# Patient Record
Sex: Male | Born: 1995 | Race: White | Hispanic: Yes | Marital: Single | Smoking: Never smoker
Health system: Southern US, Community
[De-identification: ages and names within clinical notes are randomized; demographics above are authoritative.]

## PROBLEM LIST (undated history)

## (undated) DIAGNOSIS — H9190 Unspecified hearing loss, unspecified ear: Secondary | ICD-10-CM

## (undated) DIAGNOSIS — F909 Attention-deficit hyperactivity disorder, unspecified type: Secondary | ICD-10-CM

## (undated) DIAGNOSIS — E669 Obesity, unspecified: Secondary | ICD-10-CM

## (undated) DIAGNOSIS — T7840XA Allergy, unspecified, initial encounter: Secondary | ICD-10-CM

## (undated) HISTORY — DX: Allergy, unspecified, initial encounter: T78.40XA

## (undated) HISTORY — DX: Unspecified hearing loss, unspecified ear: H91.90

## (undated) HISTORY — DX: Attention-deficit hyperactivity disorder, unspecified type: F90.9

## (undated) HISTORY — DX: Obesity, unspecified: E66.9

---
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
## 2019-07-01 ENCOUNTER — Other Ambulatory Visit: Payer: Self-pay

## 2019-07-01 ENCOUNTER — Ambulatory Visit
Admission: EM | Admit: 2019-07-01 | Discharge: 2019-07-01 | Disposition: A | Discharge status: Home / Self Care | Payer: Self-pay | Attending: Emergency Medicine | Admitting: Emergency Medicine

## 2019-07-01 ENCOUNTER — Encounter: Payer: Self-pay | Admitting: Emergency Medicine

## 2019-07-01 DIAGNOSIS — N50812 Left testicular pain: Secondary | ICD-10-CM

## 2019-07-01 DIAGNOSIS — N50811 Right testicular pain: Secondary | ICD-10-CM

## 2019-07-01 LAB — POCT URINALYSIS DIP (MANUAL ENTRY)
Bilirubin, UA: NEGATIVE
Blood, UA: NEGATIVE
Glucose, UA: NEGATIVE mg/dL
Ketones, POC UA: NEGATIVE mg/dL
Leukocytes, UA: NEGATIVE
Nitrite, UA: NEGATIVE
Protein Ur, POC: NEGATIVE mg/dL
Spec Grav, UA: 1.02 (ref 1.010–1.025)
Urobilinogen, UA: 1 E.U./dL
pH, UA: 7.5 (ref 5.0–8.0)

## 2019-07-01 NOTE — ED Notes (Signed)
Patient able to ambulate independently  

## 2019-07-01 NOTE — ED Triage Notes (Signed)
Pt presents to [facility] for assessment of bilateral testicular pain, penile pain, and muscular pain to the groin x 3 days.  Pt also c/o pink tinged urine yesterday.

## 2019-07-01 NOTE — Discharge Instructions (Signed)
23 year old male with 3-day course of throbbing, penile and bilateral testicular pain.  Patient denies penile discharge, rash.  Does endorse decreased urine output.  No hernia appreciated on exam today.  POCT urine dipstick unremarkable in office today.  Patient referred to ER for further evaluation/management (torsion rule out).

## 2019-07-01 NOTE — ED Provider Notes (Signed)
[facility]    CSN: [id number] Arrival date & time: 07/01/19  1935      History   Chief Complaint Chief Complaint  Patient presents with  . Testicle Pain    HPI Tim Evans is a 23 y.o. male presenting with 3-day course of bilateral testicular and penile pain.  States it is throbbing, pins-and-needles sensation that comes and goes and is worse with walking.  Not exacerbated by Valsalva.  Patient denies change in color or penile testicular swelling or discharge.  Patient did note a pinkish discoloration to his urine last night and around 4 or 5 this evening.  Patient is currently sexually active with one male partner, routinely using condoms.  States he was tested for STDs a few weeks ago and was negative.  Denies history of torsion, cryptorchidism, trauma to the area.   Past Medical History:  Diagnosis Date  . ADHD (attention deficit hyperactivity disorder)   . Allergy   . Asthma   . Hearing loss   . Obesity     Patient Active Problem List   Diagnosis Date Noted  . Complicated migraine 11/13/2014  . Migraine with aura and without status migrainosus, not intractable 04/22/2014  . Headache(784.0) 01/22/2014  . Dizzy spells 01/22/2014  . Learning difficulty 01/22/2014    History reviewed. No pertinent surgical history.     Home Medications    Prior to Admission medications   Medication Sig Start Date End Date Taking? Authorizing Provider  cetirizine (ZYRTEC) 10 MG tablet Take 10 mg by mouth daily.   Yes [provider]  albuterol (PROVENTIL HFA;VENTOLIN HFA) 108 (90 BASE) MCG/ACT inhaler Inhale 2 puffs into the lungs every 6 (six) hours as needed for wheezing or shortness of breath (30 minutes before activity).     [provider]  Ginkgo Biloba (GINKOBA) 40 MG TABS Take 40 mg by mouth daily.    [provider]  Magnesium Oxide 500 MG TABS Take by mouth.    [provider]  riboflavin (VITAMIN B-2) 100 MG  TABS tablet Take 100 mg by mouth daily.    [provider]  SUMAtriptan (IMITREX) 50 MG tablet TAKE ONE TABLET BY MOUTH WHEN NECESSARY FOR MODERATE TO SEVERE HEADACHE, MAXIMUM 2 TIMES A WEEK 01/05/16   Elveria RisingGoodpasture, Tina, NP  fluticasone (FLONASE) 50 MCG/ACT nasal spray Place 1 spray into the nose daily as needed for allergies or rhinitis.   07/01/19  [provider]    Family History Family History  Problem Relation Age of Onset  . Diabetes Mother        2007  . Hyperlipidemia Mother   . Migraines Mother   . Headache Mother   . Diabetes Father        2012  . ADD / ADHD Father   . Diabetes Maternal Grandmother   . Diabetes Maternal Grandfather   . Hyperlipidemia Maternal Grandfather   . Diabetes Paternal Grandfather   . Autism spectrum disorder Brother   . ADD / ADHD Brother   . Diabetes Paternal Aunt   . Diabetes Paternal Aunt     Social History Social History   Tobacco Use  . Smoking status: Never Smoker  . Smokeless tobacco: Never Used  Substance Use Topics  . Alcohol use: No  . Drug use: No     Allergies   Other   Review of Systems Review of Systems  Constitutional: Negative for fatigue and fever.  Respiratory: Negative for cough and shortness of  breath.   Cardiovascular: Negative for chest pain and palpitations.  Gastrointestinal: Negative for abdominal pain, diarrhea and vomiting.  Genitourinary: Positive for penile pain and testicular pain. Negative for discharge, dysuria, frequency, genital sores, hematuria, penile swelling, scrotal swelling and urgency.  Musculoskeletal: Negative for arthralgias and myalgias.  Skin: Negative for rash and wound.  Neurological: Negative for speech difficulty and headaches.  All other systems reviewed and are negative.    Physical Exam Triage Vital Signs ED Triage Vitals  Enc Vitals Group     BP      Pulse      Resp      Temp      Temp src      SpO2      Weight      Height      Head  Circumference      Peak Flow      Pain Score      Pain Loc      Pain Edu?      Excl. in [place]?    No data found.  Updated Vital Signs BP 134/86 (BP Location: Left Arm)   Pulse 76   Temp 98.9 F (37.2 C) (Oral)   Resp 16   SpO2 98%   Visual Acuity Right Eye Distance:   Left Eye Distance:   Bilateral Distance:    Right Eye Near:   Left Eye Near:    Bilateral Near:     Physical Exam Exam conducted with a chaperone present.  Constitutional:      General: He is not in acute distress.    Appearance: Normal appearance. He is not ill-appearing.  HENT:     Head: Normocephalic and atraumatic.  Eyes:     General: No scleral icterus.    Pupils: Pupils are equal, round, and reactive to light.  Cardiovascular:     Rate and Rhythm: Normal rate.  Pulmonary:     Effort: Pulmonary effort is normal. No respiratory distress.     Breath sounds: No wheezing.  Abdominal:     Hernia: There is no hernia in the left inguinal area or right inguinal area.  Genitourinary:    Pubic Area: No rash or pubic lice.      Penis: Uncircumcised. Tenderness present. No phimosis, paraphimosis, erythema, discharge, swelling or lesions.      Scrotum/Testes:        Right: Tenderness present. Mass, swelling, testicular hydrocele or varicocele not present. Right testis is descended. Cremasteric reflex is present.         Left: Tenderness present. Mass, swelling, testicular hydrocele or varicocele not present. Left testis is descended. Cremasteric reflex is absent.      Epididymis:     Right: Not enlarged. Tenderness present. No mass.     Left: Not enlarged. Tenderness present. No mass.     Tanner stage (genital): 5.     Comments: Patient reporting diffuse tenderness over proximal penile shaft, testes bilaterally.  No discoloration present throughout Lymphadenopathy:     Lower Body: No right inguinal adenopathy. No left inguinal adenopathy.  Skin:    Coloration: Skin is not jaundiced or pale.  Neurological:      Mental Status: He is alert and oriented to person, place, and time.      UC Treatments / Results  Labs (all labs ordered are listed, but only abnormal results are displayed) Labs Reviewed  POCT URINALYSIS DIP (MANUAL ENTRY)    EKG   Radiology No results found.  Procedures  Procedures (including critical care time)  Medications Ordered in UC Medications - No data to display  Initial Impression / Assessment and Plan / UC Course  I have reviewed the triage vital signs and the nursing notes.  Pertinent labs & imaging results that were available during my care of the patient were reviewed by me and considered in my medical decision making (see chart for details).     Patient afebrile, nontoxic.  POCT urine dipstick done in office, reviewed by me: Unremarkable.  Due to negative cremasteric reflex with diffuse tenderness with and without palpation, this provider recommended patient go to ER for stat ultrasound given after hours and concern for testicular torsion.  Patient verbalized understanding, will self transport to ER.  Return precautions discussed, patient verbalized understanding and is agreeable to plan. Final Clinical Impressions(s) / UC Diagnoses   Final diagnoses:  Pain in both testicles     Discharge Instructions     23 year old male with 3-day course of throbbing, penile and bilateral testicular pain.  Patient denies penile discharge, rash.  Does endorse decreased urine output.  No hernia appreciated on exam today.  POCT urine dipstick unremarkable in office today.  Patient referred to ER for further evaluation/management (torsion rule out).    ED Prescriptions    None     PDMP not reviewed this encounter.   Odette Fraction [place], [place] 07/04/19 [phone]

## 2019-08-14 ENCOUNTER — Ambulatory Visit: Payer: Self-pay | Attending: Internal Medicine

## 2019-08-14 DIAGNOSIS — Z20822 Contact with and (suspected) exposure to covid-19: Secondary | ICD-10-CM

## 2019-08-14 DIAGNOSIS — Z20828 Contact with and (suspected) exposure to other viral communicable diseases: Secondary | ICD-10-CM | POA: Insufficient documentation

## 2019-08-15 LAB — NOVEL CORONAVIRUS, NAA: SARS-CoV-2, NAA: NOT DETECTED

## 2022-02-21 ENCOUNTER — Ambulatory Visit
Admission: RE | Admit: 2022-02-21 | Discharge: 2022-02-21 | Disposition: A | Discharge status: Home / Self Care | Payer: 59 | Source: Ambulatory Visit | Attending: Emergency Medicine | Admitting: Emergency Medicine

## 2022-02-21 VITALS — BP 121/77 | HR 89 | Temp 98.1°F | Resp 18

## 2022-02-21 DIAGNOSIS — J4521 Mild intermittent asthma with (acute) exacerbation: Secondary | ICD-10-CM

## 2022-02-21 DIAGNOSIS — J309 Allergic rhinitis, unspecified: Secondary | ICD-10-CM | POA: Diagnosis not present

## 2022-02-21 MED ORDER — MONTELUKAST SODIUM 10 MG PO TABS: 10.0000 mg | ORAL_TABLET | Freq: Every day | ORAL | 2 refills | Status: AC

## 2022-02-21 MED ORDER — ALBUTEROL SULFATE HFA 108 (90 BASE) MCG/ACT IN AERS: 2.0000 | INHALATION_SPRAY | Freq: Four times a day (QID) | RESPIRATORY_TRACT | 0 refills | Status: DC | PRN

## 2022-02-21 MED ORDER — FLUTICASONE PROPIONATE 50 MCG/ACT NA SUSP: 1.0000 | Freq: Every day | NASAL | 2 refills | Status: AC

## 2022-02-21 MED ORDER — CETIRIZINE HCL 10 MG PO TABS: 10.0000 mg | ORAL_TABLET | Freq: Every day | ORAL | 1 refills | Status: AC

## 2022-02-21 NOTE — ED Notes (Signed)
Pt currently being registered.

## 2022-02-21 NOTE — Discharge Instructions (Signed)
Your symptoms and my physical exam findings are concerning for exacerbation of your underlying allergies.     Please see the list below for recommended medications, dosages and frequencies to provide relief of current symptoms:     Zyrtec (cetirizine): This is an excellent second-generation antihistamine that helps to reduce respiratory inflammatory response to environmental allergens.  In some patients, this medication can cause daytime sleepiness so I recommend that you take 1 tablet daily at bedtime.     Singulair (montelukast): This is a mast cell stabilizer that works well with antihistamines.  Mast cells are responsible for stimulating histamine production so you can imagine that if we can reduce the activity of your mast cells, then fewer histamines will be produced and inflammation caused by allergy exposure will be significantly reduced.  I recommend that you take this medication at the same time you take your antihistamine.   Flonase (fluticasone): This is a steroid nasal spray that you use once daily, 1 spray in each nare.  This medication does not work well if you decide to use it only used as you feel you need to, it works best used on a daily basis.  After 3 to 5 days of use, you will notice significant reduction of the inflammation and mucus production that is currently being caused by exposure to allergens, whether seasonal or environmental.  The most common side effect of this medication is nosebleeds.  If you experience a nosebleed, please discontinue use for 1 week, then feel free to resume.  I have provided you with a prescription.     If your insurance will not cover your allergy medications, please consider downloading the Good Rx app which is free.  You can find considerable discounts on prescription and over-the-counter medications.   If you find that you have not had improvement of your symptoms in the next 5 to 7 days, please follow-up with your primary care provider or return  here to urgent care for repeat evaluation and further recommendations.   Thank you for visiting urgent care today.  We appreciate the opportunity to participate in your care.

## 2022-02-21 NOTE — ED Triage Notes (Signed)
Pt c/o sore throat, cough, headache, and congestion.  Started: last Thursday Home interventions: Mucinex, nyquil, dayquil, vitamins & supplements, zyrtec.

## 2022-02-21 NOTE — ED Provider Notes (Signed)
[facility]    CSN: [id number] Arrival date & time: 02/21/22  1254    HISTORY   Chief Complaint  Patient presents with   Cough    Entered by patient   Sore Throat   Nasal Congestion   HPI Tim Evans is a 26 y.o. male. Patient presents to urgent care complaining of nonproductive cough, headache, sore throat, nasal congestion.  Patient has a history of allergies and asthma, states he has been taking Zyrtec every day but his albuterol inhaler is out of date.  Patient has normal vital signs on arrival today and appears to be in no acute distress.  The history is provided by the patient.   Past Medical History:  Diagnosis Date   ADHD (attention deficit hyperactivity disorder)    Allergy    Asthma    Hearing loss    Obesity    Patient Active Problem List   Diagnosis Date Noted   Complicated migraine 11/13/2014   Migraine with aura and without status migrainosus, not intractable 04/22/2014   Headache(784.0) 01/22/2014   Dizzy spells 01/22/2014   Learning difficulty 01/22/2014   History reviewed. No pertinent surgical history.  Home Medications    Prior to Admission medications   Medication Sig Start Date End Date Taking? Authorizing Provider  albuterol (PROVENTIL HFA;VENTOLIN HFA) 108 (90 BASE) MCG/ACT inhaler Inhale 2 puffs into the lungs every 6 (six) hours as needed for wheezing or shortness of breath (30 minutes before activity).     [provider]  cetirizine (ZYRTEC) 10 MG tablet Take 10 mg by mouth daily.    [provider]  fluticasone (FLONASE) 50 MCG/ACT nasal spray Place 1 spray into the nose daily as needed for allergies or rhinitis.   07/01/19  [provider]   Family History Family History  Problem Relation Age of Onset   Diabetes Mother        2007   Hyperlipidemia Mother    Migraines Mother    Headache Mother    Diabetes Father        2012   ADD / ADHD Father    Diabetes Maternal Grandmother     Diabetes Maternal Grandfather    Hyperlipidemia Maternal Grandfather    Diabetes Paternal Grandfather    Autism spectrum disorder Brother    ADD / ADHD Brother    Diabetes Paternal Aunt    Diabetes Paternal Aunt    Social History Social History   Tobacco Use   Smoking status: Never   Smokeless tobacco: Never  Substance Use Topics   Alcohol use: No   Drug use: No   Allergies   Other  Review of Systems Review of Systems Pertinent findings noted in history of present illness.   Physical Exam Triage Vital Signs ED Triage Vitals  Enc Vitals Group     BP 06/11/21 0827 (!) 147/82     Pulse Rate 06/11/21 0827 72     Resp 06/11/21 0827 18     Temp 06/11/21 0827 98.3 F (36.8 C)     Temp Source 06/11/21 0827 Oral     SpO2 06/11/21 0827 98 %     Weight --      Height --      Head Circumference --      Peak Flow --      Pain Score 06/11/21 0826 5     Pain Loc --      Pain Edu? --      Excl.  in GC? --   No data found.  Updated Vital Signs BP 121/77 (BP Location: Right Arm)   Pulse 89   Temp 98.1 F (36.7 C) (Oral)   Resp 18   SpO2 96%   Physical Exam Vitals and nursing note reviewed.  Constitutional:      General: He is not in acute distress.    Appearance: Normal appearance. He is not ill-appearing.  HENT:     Head: Normocephalic and atraumatic.     Salivary Glands: Right salivary gland is not diffusely enlarged or tender. Left salivary gland is not diffusely enlarged or tender.     Right Ear: Ear canal and external ear normal. No drainage. A middle ear effusion is present. There is no impacted cerumen. Tympanic membrane is bulging. Tympanic membrane is not injected or erythematous.     Left Ear: Ear canal and external ear normal. No drainage. A middle ear effusion is present. There is no impacted cerumen. Tympanic membrane is bulging. Tympanic membrane is not injected or erythematous.     Ears:     Comments: Bilateral EACs normal, both TMs bulging with clear  fluid    Nose: Rhinorrhea present. No nasal deformity, septal deviation, signs of injury, nasal tenderness, mucosal edema or congestion. Rhinorrhea is clear.     Right Nostril: Occlusion present. No foreign body, epistaxis or septal hematoma.     Left Nostril: Occlusion present. No foreign body, epistaxis or septal hematoma.     Right Turbinates: Enlarged, swollen and pale.     Left Turbinates: Enlarged, swollen and pale.     Right Sinus: No maxillary sinus tenderness or frontal sinus tenderness.     Left Sinus: No maxillary sinus tenderness or frontal sinus tenderness.     Mouth/Throat:     Lips: Pink. No lesions.     Mouth: Mucous membranes are moist. No oral lesions.     Pharynx: Oropharynx is clear. Uvula midline. No posterior oropharyngeal erythema or uvula swelling.     Tonsils: No tonsillar exudate. 0 on the right. 0 on the left.     Comments: Postnasal drip Eyes:     General: Lids are normal.        Right eye: No discharge.        Left eye: No discharge.     Extraocular Movements: Extraocular movements intact.     Conjunctiva/sclera: Conjunctivae normal.     Right eye: Right conjunctiva is not injected.     Left eye: Left conjunctiva is not injected.  Neck:     Trachea: Trachea and phonation normal.  Cardiovascular:     Rate and Rhythm: Normal rate and regular rhythm.     Pulses: Normal pulses.     Heart sounds: Normal heart sounds. No murmur heard.    No friction rub. No gallop.  Pulmonary:     Effort: Pulmonary effort is normal. No accessory muscle usage, prolonged expiration or respiratory distress.     Breath sounds: Normal breath sounds. No stridor, decreased air movement or transmitted upper airway sounds. No decreased breath sounds, wheezing, rhonchi or rales.  Chest:     Chest wall: No tenderness.  Musculoskeletal:        General: Normal range of motion.     Cervical back: Normal range of motion and neck supple. Normal range of motion.  Lymphadenopathy:      Cervical: No cervical adenopathy.  Skin:    General: Skin is warm and dry.     Findings: No  erythema or rash.  Neurological:     General: No focal deficit present.     Mental Status: He is alert and oriented to person, place, and time.  Psychiatric:        Mood and Affect: Mood normal.        Behavior: Behavior normal.     Visual Acuity Right Eye Distance:   Left Eye Distance:   Bilateral Distance:    Right Eye Near:   Left Eye Near:    Bilateral Near:     UC Couse / Diagnostics / Procedures:    EKG  Radiology No results found.  Procedures Procedures (including critical care time)  UC Diagnoses / Final Clinical Impressions(s)   I have reviewed the triage vital signs and the nursing notes.  Pertinent labs & imaging results that were available during my care of the patient were reviewed by me and considered in my medical decision making (see chart for details).   Final diagnoses:  Mild intermittent asthma with acute exacerbation  Allergic rhinitis, unspecified seasonality, unspecified trigger   Patient provided with refills of Zyrtec, Flonase and a prescription for Singulair.  Have also renewed albuterol and advised patient to use it purposefully for the next 3 to 4 days.  Patient provided with a note for work as at his request.  Return precautions advised.  ED Prescriptions     Medication Sig Dispense Auth. Provider   cetirizine (ZYRTEC ALLERGY) 10 MG tablet Take 1 tablet (10 mg total) by mouth at bedtime. 90 tablet Theadora RamaMorgan, Carnisha Feltz Scales, PA-C   fluticasone (FLONASE) 50 MCG/ACT nasal spray Place 1 spray into both nostrils daily. Begin by using 2 sprays in each nare daily for 3 to 5 days, then decrease to 1 spray in each nare daily. 15.8 mL Theadora RamaMorgan, Macallister Ashmead Scales, PA-C   albuterol (VENTOLIN HFA) 108 (90 Base) MCG/ACT inhaler Inhale 2 puffs into the lungs every 6 (six) hours as needed for wheezing or shortness of breath (Cough). 18 g Theadora RamaMorgan, Shaniqwa Horsman Scales, PA-C    montelukast (SINGULAIR) 10 MG tablet Take 1 tablet (10 mg total) by mouth at bedtime. 30 tablet Theadora RamaMorgan, Alzina Golda Scales, PA-C      PDMP not reviewed this encounter.  Pending results:  Labs Reviewed - No data to display  Medications Ordered in UC: Medications - No data to display  Disposition Upon Discharge:  Condition: stable for discharge home Home: take medications as prescribed; routine discharge instructions as discussed; follow up as advised.  Patient presented with an acute illness with associated systemic symptoms and significant discomfort requiring urgent management. In my opinion, this is a condition that a prudent lay person (someone who possesses an average knowledge of health and medicine) may potentially expect to result in complications if not addressed urgently such as respiratory distress, impairment of bodily function or dysfunction of bodily organs.   Routine symptom specific, illness specific and/or disease specific instructions were discussed with the patient and/or caregiver at length.   As such, the patient has been evaluated and assessed, work-up was performed and treatment was provided in alignment with urgent care protocols and evidence based medicine.  Patient/parent/caregiver has been advised that the patient may require follow up for further testing and treatment if the symptoms continue in spite of treatment, as clinically indicated and appropriate.  If the patient was tested for COVID-19, Influenza and/or RSV, then the patient/parent/guardian was advised to isolate at home pending the results of his/her diagnostic coronavirus test and potentially longer if they're  positive. I have also advised pt that if his/her COVID-19 test returns positive, it's recommended to self-isolate for at least 10 days after symptoms first appeared AND until fever-free for 24 hours without fever reducer AND other symptoms have improved or resolved. Discussed self-isolation  recommendations as well as instructions for household member/close contacts as per the [facility] and [place] DHHS, and also gave patient the COVID packet with this information.  Patient/parent/caregiver has been advised to return [facility] or PCP in 3-5 days if no better; to PCP or the Emergency Department if new signs and symptoms develop, or if the current signs or symptoms continue to change or worsen for further workup, evaluation and treatment as clinically indicated and appropriate  The patient will follow up with their current PCP if and as advised. If the patient does not currently have a PCP we will assist them in obtaining one.   The patient may need specialty follow up if the symptoms continue, in spite of conservative treatment and management, for further workup, evaluation, consultation and treatment as clinically indicated and appropriate.  Patient/parent/caregiver verbalized understanding and agreement of plan as discussed.  All questions were addressed during visit.  Please see discharge instructions below for further details of plan.  Discharge Instructions:   Discharge Instructions      Your symptoms and my physical exam findings are concerning for exacerbation of your underlying allergies.     Please see the list below for recommended medications, dosages and frequencies to provide relief of current symptoms:     Zyrtec (cetirizine): This is an excellent second-generation antihistamine that helps to reduce respiratory inflammatory response to environmental allergens.  In some patients, this medication can cause daytime sleepiness so I recommend that you take 1 tablet daily at bedtime.     Singulair (montelukast): This is a mast cell stabilizer that works well with antihistamines.  Mast cells are responsible for stimulating histamine production so you can imagine that if we can reduce the activity of your mast cells, then fewer histamines will be produced and inflammation caused by  allergy exposure will be significantly reduced.  I recommend that you take this medication at the same time you take your antihistamine.   Flonase (fluticasone): This is a steroid nasal spray that you use once daily, 1 spray in each nare.  This medication does not work well if you decide to use it only used as you feel you need to, it works best used on a daily basis.  After 3 to 5 days of use, you will notice significant reduction of the inflammation and mucus production that is currently being caused by exposure to allergens, whether seasonal or environmental.  The most common side effect of this medication is nosebleeds.  If you experience a nosebleed, please discontinue use for 1 week, then feel free to resume.  I have provided you with a prescription.     If your insurance will not cover your allergy medications, please consider downloading the Good Rx app which is free.  You can find considerable discounts on prescription and over-the-counter medications.   If you find that you have not had improvement of your symptoms in the next 5 to 7 days, please follow-up with your primary care provider or return here to urgent care for repeat evaluation and further recommendations.   Thank you for visiting urgent care today.  We appreciate the opportunity to participate in your care.       This office note has been  dictated using Teaching laboratory technician.  Unfortunately, and despite my best efforts, this method of dictation can sometimes lead to occasional typographical or grammatical errors.  I apologize in advance if this occurs.     Theadora Rama Scales, PA-C 02/21/22 1337

## 2022-03-04 ENCOUNTER — Ambulatory Visit
Admission: RE | Admit: 2022-03-04 | Discharge: 2022-03-04 | Disposition: A | Discharge status: Home / Self Care | Payer: 59 | Source: Ambulatory Visit | Attending: Urgent Care | Admitting: Urgent Care

## 2022-03-04 VITALS — BP 109/77 | HR 91 | Temp 98.9°F | Resp 18

## 2022-03-04 DIAGNOSIS — H9201 Otalgia, right ear: Secondary | ICD-10-CM | POA: Diagnosis not present

## 2022-03-04 DIAGNOSIS — R052 Subacute cough: Secondary | ICD-10-CM | POA: Diagnosis not present

## 2022-03-04 DIAGNOSIS — R070 Pain in throat: Secondary | ICD-10-CM | POA: Diagnosis not present

## 2022-03-04 DIAGNOSIS — J019 Acute sinusitis, unspecified: Secondary | ICD-10-CM | POA: Diagnosis not present

## 2022-03-04 DIAGNOSIS — J4530 Mild persistent asthma, uncomplicated: Secondary | ICD-10-CM

## 2022-03-04 DIAGNOSIS — J309 Allergic rhinitis, unspecified: Secondary | ICD-10-CM

## 2022-03-04 LAB — POCT RAPID STREP A (OFFICE): Rapid Strep A Screen: NEGATIVE

## 2022-03-04 MED ORDER — ALBUTEROL SULFATE HFA 108 (90 BASE) MCG/ACT IN AERS: 2.0000 | INHALATION_SPRAY | Freq: Four times a day (QID) | RESPIRATORY_TRACT | 0 refills | Status: AC | PRN

## 2022-03-04 MED ORDER — AMOXICILLIN-POT CLAVULANATE 875-125 MG PO TABS: 1.0000 | ORAL_TABLET | Freq: Two times a day (BID) | ORAL | 0 refills | Status: AC

## 2022-03-04 MED ORDER — PREDNISONE 20 MG PO TABS: ORAL_TABLET | ORAL | 0 refills | Status: DC

## 2022-03-04 MED ORDER — PREDNISONE 20 MG PO TABS: ORAL_TABLET | ORAL | 0 refills | Status: AC

## 2022-03-04 MED ORDER — PROMETHAZINE-DM 6.25-15 MG/5ML PO SYRP: 2.5000 mL | ORAL_SOLUTION | Freq: Three times a day (TID) | ORAL | 0 refills | Status: DC | PRN

## 2022-03-04 MED ORDER — ALBUTEROL SULFATE HFA 108 (90 BASE) MCG/ACT IN AERS: 2.0000 | INHALATION_SPRAY | Freq: Four times a day (QID) | RESPIRATORY_TRACT | 0 refills | Status: DC | PRN

## 2022-03-04 MED ORDER — PROMETHAZINE-DM 6.25-15 MG/5ML PO SYRP: 2.5000 mL | ORAL_SOLUTION | Freq: Three times a day (TID) | ORAL | 0 refills | Status: AC | PRN

## 2022-03-04 MED ORDER — AMOXICILLIN-POT CLAVULANATE 875-125 MG PO TABS: 1.0000 | ORAL_TABLET | Freq: Two times a day (BID) | ORAL | 0 refills | Status: DC

## 2022-03-04 NOTE — ED Triage Notes (Signed)
Pt c/o cough, sore throat and rt ear pain, he states he has been coughing up blood.   Started: about 2 weeks ago, the blood is new   He states his at home covid test was negative yesterday and today it was positive.

## 2022-03-04 NOTE — ED Provider Notes (Signed)
[facility]   MRN: [id number] DOB: Aug 28, 1995  Subjective:   Tim Evans is a 26 y.o. male presenting for 2 week history of persistent coughing, throat pain, drainage, right ear pain.  He became concerned in the past few days as he noticed a little blood in his sputum.  Would like to have a COVID test, flu test and a strep test which is what his work wants him to do as well.  Needs a note in documentation for his visit.  He did an at-home COVID test and was negative.  Has a history of allergies and asthma.  Takes Zyrtec daily.  Would like a refill on his albuterol inhaler.  He is no longer smoking.  No overt chest pain, shortness of breath or wheezing, fevers, body pains.  No current facility-administered medications for this encounter.  Current Outpatient Medications:    albuterol (VENTOLIN HFA) 108 (90 Base) MCG/ACT inhaler, Inhale 2 puffs into the lungs every 6 (six) hours as needed for wheezing or shortness of breath (Cough)., Disp: 18 g, Rfl: 0   cetirizine (ZYRTEC ALLERGY) 10 MG tablet, Take 1 tablet (10 mg total) by mouth at bedtime., Disp: 90 tablet, Rfl: 1   fluticasone (FLONASE) 50 MCG/ACT nasal spray, Place 1 spray into both nostrils daily. Begin by using 2 sprays in each nare daily for 3 to 5 days, then decrease to 1 spray in each nare daily., Disp: 15.8 mL, Rfl: 2   montelukast (SINGULAIR) 10 MG tablet, Take 1 tablet (10 mg total) by mouth at bedtime., Disp: 30 tablet, Rfl: 2   Allergies  Allergen Reactions   Other     Seasonal Allergies    Past Medical History:  Diagnosis Date   ADHD (attention deficit hyperactivity disorder)    Allergy    Asthma    Hearing loss    Obesity      History reviewed. No pertinent surgical history.  Family History  Problem Relation Age of Onset   Diabetes Mother        2007   Hyperlipidemia Mother    Migraines Mother    Headache Mother    Diabetes Father        2012   ADD / ADHD Father     Diabetes Maternal Grandmother    Diabetes Maternal Grandfather    Hyperlipidemia Maternal Grandfather    Diabetes Paternal Grandfather    Autism spectrum disorder Brother    ADD / ADHD Brother    Diabetes Paternal Aunt    Diabetes Paternal Aunt     Social History   Tobacco Use   Smoking status: Never   Smokeless tobacco: Never  Substance Use Topics   Alcohol use: No   Drug use: No    ROS   Objective:   Vitals: BP 109/77 (BP Location: Left Arm)   Pulse 91   Temp 98.9 F (37.2 C) (Oral)   Resp 18   SpO2 98%   Physical Exam Constitutional:      General: He is not in acute distress.    Appearance: Normal appearance. He is well-developed and normal weight. He is not ill-appearing, toxic-appearing or diaphoretic.  HENT:     Head: Normocephalic and atraumatic.     Right Ear: Tympanic membrane, ear canal and external ear normal. No drainage, swelling or tenderness. No middle ear effusion. There is no impacted cerumen. Tympanic membrane is not erythematous.     Left Ear: Tympanic membrane, ear canal and external  ear normal. No drainage, swelling or tenderness.  No middle ear effusion. There is no impacted cerumen. Tympanic membrane is not erythematous.     Nose: Congestion present. No rhinorrhea.     Mouth/Throat:     Mouth: Mucous membranes are moist.     Pharynx: Posterior oropharyngeal erythema (with significant postnasal drainage overlying pharynx) present. No pharyngeal swelling, oropharyngeal exudate or uvula swelling.  Eyes:     General: No scleral icterus.       Right eye: No discharge.        Left eye: No discharge.     Extraocular Movements: Extraocular movements intact.     Conjunctiva/sclera: Conjunctivae normal.  Cardiovascular:     Rate and Rhythm: Normal rate.  Pulmonary:     Effort: Pulmonary effort is normal.  Musculoskeletal:     Cervical back: Normal range of motion and neck supple. No rigidity. No muscular tenderness.  Neurological:     General: No  focal deficit present.     Mental Status: He is alert and oriented to person, place, and time.  Psychiatric:        Mood and Affect: Mood normal.        Behavior: Behavior normal.        Thought Content: Thought content normal.        Judgment: Judgment normal.     Results for orders placed or performed during the hospital encounter of 03/04/22 (from the past 24 hour(s))  POCT rapid strep A     Status: None   Collection Time: 03/04/22  2:48 PM  Result Value Ref Range   Rapid Strep A Screen Negative Negative    Assessment and Plan :   PDMP not reviewed this encounter.  1. Acute non-recurrent sinusitis, unspecified location   2. Throat pain   3. Right ear pain   4. Subacute cough   5. Allergic rhinitis, unspecified seasonality, unspecified trigger   6. Mild persistent asthma, uncomplicated    Will start empiric treatment for sinusitis with Augmentin.  Recommended supportive care otherwise including the use of oral antihistamine.  I refilled his albuterol inhaler.  We will also use an oral prednisone course to help with the context of his asthma, allergic rhinitis.  Deferred imaging given clear cardiopulmonary exam, hemodynamically stable vital signs. Strep was negative and since I am using Augmentin will defer throat culture. Counseled patient on potential for adverse effects with medications prescribed/recommended today, ER and return-to-clinic precautions discussed, patient verbalized understanding.    Wallis Bamberg, [place] 03/04/22 [id number]

## 2022-03-05 LAB — COVID-19, FLU A+B NAA
Influenza A, NAA: NOT DETECTED
Influenza B, NAA: NOT DETECTED
SARS-CoV-2, NAA: NOT DETECTED

## 2022-07-09 ENCOUNTER — Ambulatory Visit (INDEPENDENT_AMBULATORY_CARE_PROVIDER_SITE_OTHER): Payer: 59

## 2022-07-09 ENCOUNTER — Ambulatory Visit
Admission: EM | Admit: 2022-07-09 | Discharge: 2022-07-09 | Disposition: A | Discharge status: Home / Self Care | Payer: 59 | Attending: Urgent Care | Admitting: Urgent Care

## 2022-07-09 DIAGNOSIS — S9032XA Contusion of left foot, initial encounter: Secondary | ICD-10-CM

## 2022-07-09 DIAGNOSIS — M79672 Pain in left foot: Secondary | ICD-10-CM

## 2022-07-09 MED ORDER — NAPROXEN 500 MG PO TABS: 500.0000 mg | ORAL_TABLET | Freq: Two times a day (BID) | ORAL | 0 refills | Status: DC

## 2022-07-09 MED ORDER — IBUPROFEN 800 MG PO TABS
800.0000 mg | ORAL_TABLET | Freq: Once | ORAL | Status: AC
  Administered 2022-07-09: 800 mg via ORAL
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
## 2023-12-09 ENCOUNTER — Ambulatory Visit (INDEPENDENT_AMBULATORY_CARE_PROVIDER_SITE_OTHER)

## 2023-12-09 ENCOUNTER — Ambulatory Visit
Admission: EM | Admit: 2023-12-09 | Discharge: 2023-12-09 | Disposition: A | Discharge status: Home / Self Care | Attending: Family Medicine | Admitting: Family Medicine

## 2023-12-09 ENCOUNTER — Encounter: Payer: Self-pay | Admitting: Urgent Care

## 2023-12-09 DIAGNOSIS — M545 Low back pain, unspecified: Secondary | ICD-10-CM | POA: Diagnosis not present

## 2023-12-09 DIAGNOSIS — L600 Ingrowing nail: Secondary | ICD-10-CM | POA: Diagnosis not present

## 2023-12-09 DIAGNOSIS — M25561 Pain in right knee: Secondary | ICD-10-CM | POA: Diagnosis not present

## 2023-12-09 DIAGNOSIS — M2550 Pain in unspecified joint: Secondary | ICD-10-CM

## 2023-12-09 DIAGNOSIS — M25562 Pain in left knee: Secondary | ICD-10-CM

## 2023-12-09 MED ORDER — CYCLOBENZAPRINE HCL 5 MG PO TABS: 5.0000 mg | ORAL_TABLET | Freq: Every evening | ORAL | 0 refills | Status: AC | PRN

## 2023-12-09 MED ORDER — NAPROXEN 500 MG PO TABS: 500.0000 mg | ORAL_TABLET | Freq: Two times a day (BID) | ORAL | 0 refills | Status: AC

## 2023-12-09 NOTE — ED Provider Notes (Signed)
 [facility]  Note:  This document was prepared using Conservation officer, historic buildings and may include unintentional dictation errors.  MRN: [id number] DOB: 08/30/1995  Subjective:   Tim Evans is a 28 y.o. male presenting for 63-month history of persistent multiple joint pains.  Patient has experience primarily pain of his low back, both his knees.  But also reports that the pain has radiated toward the thoracic region, either flank side, the ankles and feet.  He is requesting imaging.  He also has concerns about right ingrown toenail.  Reports pain but no drainage of pus or bleeding.  No current facility-administered medications for this encounter.  Current Outpatient Medications:    albuterol  (VENTOLIN  HFA) 108 (90 Base) MCG/ACT inhaler, Inhale 2 puffs into the lungs every 6 (six) hours as needed for wheezing or shortness of breath (Cough)., Disp: 18 g, Rfl: 0   amoxicillin -clavulanate (AUGMENTIN ) 875-125 MG tablet, Take 1 tablet by mouth 2 (two) times daily., Disp: 20 tablet, Rfl: 0   cetirizine  (ZYRTEC  ALLERGY) 10 MG tablet, Take 1 tablet (10 mg total) by mouth at bedtime., Disp: 90 tablet, Rfl: 1   fluticasone  (FLONASE ) 50 MCG/ACT nasal spray, Place 1 spray into both nostrils daily. Begin by using 2 sprays in each nare daily for 3 to 5 days, then decrease to 1 spray in each nare daily., Disp: 15.8 mL, Rfl: 2   montelukast  (SINGULAIR ) 10 MG tablet, Take 1 tablet (10 mg total) by mouth at bedtime., Disp: 30 tablet, Rfl: 2   naproxen  (NAPROSYN ) 500 MG tablet, Take 1 tablet (500 mg total) by mouth 2 (two) times daily with a meal., Disp: 30 tablet, Rfl: 0   predniSONE  (DELTASONE ) 20 MG tablet, Take 2 tablets daily with breakfast., Disp: 10 tablet, Rfl: 0   promethazine -dextromethorphan (PROMETHAZINE -DM) 6.25-15 MG/5ML syrup, Take 2.5 mLs by mouth 3 (three) times daily as needed for cough., Disp: 100 mL, Rfl: 0   No Known Allergies  Past Medical History:   Diagnosis Date   ADHD (attention deficit hyperactivity disorder)    Allergy    Asthma    Hearing loss    Obesity      History reviewed. No pertinent surgical history.  Family History  Problem Relation Age of Onset   Diabetes Mother        2007   Hyperlipidemia Mother    Migraines Mother    Headache Mother    Diabetes Father        2012   ADD / ADHD Father    Diabetes Maternal Grandmother    Diabetes Maternal Grandfather    Hyperlipidemia Maternal Grandfather    Diabetes Paternal Grandfather    Autism spectrum disorder Brother    ADD / ADHD Brother    Diabetes Paternal Aunt    Diabetes Paternal Aunt     Social History   Tobacco Use   Smoking status: Never   Smokeless tobacco: Never  Vaping Use   Vaping status: Never Used  Substance Use Topics   Alcohol use: Yes    Comment: occ   Drug use: No    ROS   Objective:   Vitals: BP 115/76 (BP Location: Right Arm)   Pulse 80   Temp 98.7 F (37.1 C) (Oral)   Resp 16   SpO2 98%   Physical Exam Constitutional:      General: He is not in acute distress.    Appearance: Normal appearance. He is well-developed and normal weight. He is  not ill-appearing, toxic-appearing or diaphoretic.  HENT:     Head: Normocephalic and atraumatic.     Right Ear: External ear normal.     Left Ear: External ear normal.     Nose: Nose normal.     Mouth/Throat:     Pharynx: Oropharynx is clear.  Eyes:     General: No scleral icterus.       Right eye: No discharge.        Left eye: No discharge.     Extraocular Movements: Extraocular movements intact.  Cardiovascular:     Rate and Rhythm: Normal rate.  Pulmonary:     Effort: Pulmonary effort is normal.  Musculoskeletal:     Cervical back: Normal range of motion.     Lumbar back: No swelling, edema, deformity, signs of trauma, lacerations, spasms, tenderness or bony tenderness. Normal range of motion. Negative right straight leg raise test and negative left straight leg raise  test. No scoliosis.     Comments: Full range of motion throughout.  Strength 5/5 for upper and lower extremities.  Patient ambulates without any assistance at expected pace.  No ecchymosis, swelling, lacerations or abrasions.  Patient does have paraspinal muscle tenderness along the entire back excluding the midline worse over the lumbar region and sparing the cervical region.  No tenderness elicited at the level of the knees.  Skin:    Comments: Medial ingrown toenail of the right great toe.  No warmth, erythema, drainage of pus or bleeding.  There is trace swelling medially.  No paronychia.  Neurological:     Mental Status: He is alert and oriented to person, place, and time.  Psychiatric:        Mood and Affect: Mood normal.        Behavior: Behavior normal.        Thought Content: Thought content normal.        Judgment: Judgment normal.    DG Lumbar Spine Complete Result Date: 12/09/2023 CLINICAL DATA:  Low back pain EXAM: LUMBAR SPINE - COMPLETE 4 VIEW COMPARISON:  None Available. FINDINGS: There is no evidence of lumbar spine fracture. Alignment is normal. Intervertebral disc spaces are maintained. IMPRESSION: Negative. Electronically Signed   By: Ronnette Coke M.D.   On: 12/09/2023 11:48     Assessment and Plan :   PDMP not reviewed this encounter.  1. Acute bilateral low back pain without sciatica   2. Ingrown toenail without infection   3. Multiple joint pain   4. Acute bilateral knee pain    Advised that imaging would be of low yield but ultimately agreed to pursue a lumbar series.  Suspect inflammatory pain likely related to the strenuous nature of his work.  Recommended NSAID, muscle relaxant, rest.  Placed a referral to podiatry for his ingrown toenail.  No signs of secondary infection.  Will defer antibiotic use.  Counseled patient on potential for adverse effects with medications prescribed/recommended today, ER and return-to-clinic precautions discussed, patient  verbalized understanding.    Adolph Hoop, PA-C 12/09/23 1236

## 2023-12-09 NOTE — ED Triage Notes (Signed)
 Pt states mid back and rib cage pain for the past 6 months.  States he is also having bilateral knee pain.  Also states he is having pain and swelling to his right great toe.

## 2023-12-10 ENCOUNTER — Ambulatory Visit: Payer: Self-pay

## 2023-12-13 ENCOUNTER — Inpatient Hospital Stay (INDEPENDENT_AMBULATORY_CARE_PROVIDER_SITE_OTHER)
Admission: AD | Admit: 9998-08-14 | Discharge: 9998-08-14 | DRG: 988 | Disposition: A | Discharge status: Still In Hospital | Source: Intra-hospital | Attending: Family Medicine | Admitting: *Deleted

## 2023-12-18 ENCOUNTER — Ambulatory Visit: Admitting: Podiatry

## 2023-12-27 ENCOUNTER — Ambulatory Visit: Admitting: Podiatry

## 2024-08-02 ENCOUNTER — Inpatient Hospital Stay
Admit: 9998-08-14 | Discharge: 9998-08-14 | DRG: 999 | Disposition: A | Discharge status: Skilled Nursing Facility | Payer: 59 | Source: Skilled Nursing Facility | Attending: *Deleted | Admitting: *Deleted

## 2024-08-06 ENCOUNTER — Inpatient Hospital Stay
Admit: 9998-08-14 | Discharge: 9998-08-14 | DRG: 999 | Disposition: A | Discharge status: Skilled Nursing Facility | Payer: 59 | Source: Skilled Nursing Facility | Attending: *Deleted | Admitting: *Deleted

## 2024-08-17 ENCOUNTER — Ambulatory Visit: Payer: Self-pay
